# Patient Record
Sex: Female | Born: 1935 | Race: White | Hispanic: No | Smoking: Never smoker
Health system: Southern US, Community
[De-identification: ages and names within clinical notes are randomized; demographics above are authoritative.]

## PROBLEM LIST (undated history)

## (undated) DIAGNOSIS — I639 Cerebral infarction, unspecified: Secondary | ICD-10-CM

## (undated) DIAGNOSIS — E119 Type 2 diabetes mellitus without complications: Secondary | ICD-10-CM

## (undated) DIAGNOSIS — M1990 Unspecified osteoarthritis, unspecified site: Secondary | ICD-10-CM

## (undated) DIAGNOSIS — K5790 Diverticulosis of intestine, part unspecified, without perforation or abscess without bleeding: Secondary | ICD-10-CM

## (undated) DIAGNOSIS — I10 Essential (primary) hypertension: Secondary | ICD-10-CM

## (undated) HISTORY — PX: CHOLECYSTECTOMY: SHX55

## (undated) HISTORY — PX: OTHER SURGICAL HISTORY: SHX169

## (undated) HISTORY — PX: GASTROSTOMY TUBE PLACEMENT: SHX655

## (undated) HISTORY — PX: CARDIAC SURGERY: SHX584

---

## 2016-04-13 ENCOUNTER — Encounter (HOSPITAL_COMMUNITY): Payer: Self-pay

## 2016-04-13 ENCOUNTER — Observation Stay (HOSPITAL_COMMUNITY)
Admission: EM | Admit: 2016-04-13 | Discharge: 2016-04-18 | DRG: 394 | Disposition: A | Discharge status: Home / Self Care | Payer: BLUE CROSS/BLUE SHIELD | Attending: Internal Medicine | Admitting: Internal Medicine

## 2016-04-13 DIAGNOSIS — I10 Essential (primary) hypertension: Secondary | ICD-10-CM | POA: Insufficient documentation

## 2016-04-13 DIAGNOSIS — E119 Type 2 diabetes mellitus without complications: Secondary | ICD-10-CM | POA: Diagnosis not present

## 2016-04-13 DIAGNOSIS — L22 Diaper dermatitis: Secondary | ICD-10-CM | POA: Insufficient documentation

## 2016-04-13 DIAGNOSIS — K521 Toxic gastroenteritis and colitis: Secondary | ICD-10-CM | POA: Insufficient documentation

## 2016-04-13 DIAGNOSIS — T85598A Other mechanical complication of other gastrointestinal prosthetic devices, implants and grafts, initial encounter: Secondary | ICD-10-CM | POA: Diagnosis present

## 2016-04-13 DIAGNOSIS — Z7401 Bed confinement status: Secondary | ICD-10-CM | POA: Insufficient documentation

## 2016-04-13 DIAGNOSIS — Z8673 Personal history of transient ischemic attack (TIA), and cerebral infarction without residual deficits: Secondary | ICD-10-CM

## 2016-04-13 DIAGNOSIS — R5381 Other malaise: Secondary | ICD-10-CM | POA: Diagnosis not present

## 2016-04-13 DIAGNOSIS — F0150 Vascular dementia without behavioral disturbance: Secondary | ICD-10-CM | POA: Diagnosis not present

## 2016-04-13 DIAGNOSIS — K5900 Constipation, unspecified: Secondary | ICD-10-CM | POA: Diagnosis not present

## 2016-04-13 DIAGNOSIS — K219 Gastro-esophageal reflux disease without esophagitis: Secondary | ICD-10-CM | POA: Insufficient documentation

## 2016-04-13 DIAGNOSIS — Z794 Long term (current) use of insulin: Secondary | ICD-10-CM | POA: Diagnosis not present

## 2016-04-13 DIAGNOSIS — B964 Proteus (mirabilis) (morganii) as the cause of diseases classified elsewhere: Secondary | ICD-10-CM | POA: Diagnosis not present

## 2016-04-13 DIAGNOSIS — N3000 Acute cystitis without hematuria: Secondary | ICD-10-CM

## 2016-04-13 DIAGNOSIS — K9413 Enterostomy malfunction: Secondary | ICD-10-CM | POA: Insufficient documentation

## 2016-04-13 DIAGNOSIS — N390 Urinary tract infection, site not specified: Secondary | ICD-10-CM | POA: Diagnosis not present

## 2016-04-13 DIAGNOSIS — F0390 Unspecified dementia without behavioral disturbance: Secondary | ICD-10-CM | POA: Diagnosis not present

## 2016-04-13 DIAGNOSIS — M62461 Contracture of muscle, right lower leg: Secondary | ICD-10-CM | POA: Insufficient documentation

## 2016-04-13 DIAGNOSIS — Z79899 Other long term (current) drug therapy: Secondary | ICD-10-CM | POA: Diagnosis not present

## 2016-04-13 DIAGNOSIS — M62462 Contracture of muscle, left lower leg: Secondary | ICD-10-CM

## 2016-04-13 DIAGNOSIS — T3695XA Adverse effect of unspecified systemic antibiotic, initial encounter: Secondary | ICD-10-CM | POA: Diagnosis not present

## 2016-04-13 HISTORY — DX: Type 2 diabetes mellitus without complications: E11.9

## 2016-04-13 HISTORY — DX: Unspecified osteoarthritis, unspecified site: M19.90

## 2016-04-13 HISTORY — DX: Diverticulosis of intestine, part unspecified, without perforation or abscess without bleeding: K57.90

## 2016-04-13 HISTORY — DX: Cerebral infarction, unspecified: I63.9

## 2016-04-13 HISTORY — DX: Essential (primary) hypertension: I10

## 2016-04-13 LAB — CBC WITH DIFFERENTIAL/PLATELET
Basophils Absolute: 0 10*3/uL (ref 0.0–0.1)
Basophils Relative: 0 %
Eosinophils Absolute: 0.2 10*3/uL (ref 0.0–0.7)
Eosinophils Relative: 2 %
HCT: 31.7 % — ABNORMAL LOW (ref 36.0–46.0)
Hemoglobin: 10 g/dL — ABNORMAL LOW (ref 12.0–15.0)
Lymphocytes Relative: 22 %
Lymphs Abs: 2.4 10*3/uL (ref 0.7–4.0)
MCH: 25 pg — ABNORMAL LOW (ref 26.0–34.0)
MCHC: 31.5 g/dL (ref 30.0–36.0)
MCV: 79.3 fL (ref 78.0–100.0)
Monocytes Absolute: 0.8 10*3/uL (ref 0.1–1.0)
Monocytes Relative: 8 %
Neutro Abs: 7.2 10*3/uL (ref 1.7–7.7)
Neutrophils Relative %: 68 %
Platelets: 408 10*3/uL — ABNORMAL HIGH (ref 150–400)
RBC: 4 MIL/uL (ref 3.87–5.11)
RDW: 15.6 % — ABNORMAL HIGH (ref 11.5–15.5)
WBC: 10.6 10*3/uL — ABNORMAL HIGH (ref 4.0–10.5)

## 2016-04-13 LAB — CBG MONITORING, ED: Glucose-Capillary: 156 mg/dL — ABNORMAL HIGH (ref 65–99)

## 2016-04-13 LAB — COMPREHENSIVE METABOLIC PANEL WITH GFR
ALT: 12 U/L — ABNORMAL LOW (ref 14–54)
AST: 20 U/L (ref 15–41)
Albumin: 3.2 g/dL — ABNORMAL LOW (ref 3.5–5.0)
Alkaline Phosphatase: 49 U/L (ref 38–126)
Anion gap: 10 (ref 5–15)
BUN: 42 mg/dL — ABNORMAL HIGH (ref 6–20)
CO2: 22 mmol/L (ref 22–32)
Calcium: 9.4 mg/dL (ref 8.9–10.3)
Chloride: 106 mmol/L (ref 101–111)
Creatinine, Ser: 0.73 mg/dL (ref 0.44–1.00)
GFR calc Af Amer: >60 mL/min
GFR calc non Af Amer: >60 mL/min
Glucose, Bld: 140 mg/dL — ABNORMAL HIGH (ref 65–99)
Potassium: 4.9 mmol/L (ref 3.5–5.1)
Sodium: 138 mmol/L (ref 135–145)
Total Bilirubin: 0.4 mg/dL (ref 0.3–1.2)
Total Protein: 6.7 g/dL (ref 6.5–8.1)

## 2016-04-13 LAB — GLUCOSE, CAPILLARY: Glucose-Capillary: 173 mg/dL — ABNORMAL HIGH (ref 65–99)

## 2016-04-13 MED ORDER — ACETAMINOPHEN 650 MG RE SUPP
650.0000 mg | Freq: Four times a day (QID) | RECTAL | Status: DC | PRN
  Administered 2016-04-16: 650 mg via RECTAL
  Filled 2016-04-13 (×2): qty 1

## 2016-04-13 MED ORDER — ACETAMINOPHEN 325 MG PO TABS: 650.0000 mg | ORAL_TABLET | Freq: Four times a day (QID) | ORAL | Status: DC | PRN

## 2016-04-13 MED ORDER — LORAZEPAM 0.5 MG PO TABS
0.2500 mg | ORAL_TABLET | Freq: Four times a day (QID) | ORAL | Status: DC | PRN
  Administered 2016-04-14: 0.5 mg via ORAL
  Filled 2016-04-13: qty 1

## 2016-04-13 MED ORDER — VITAL 1.5 CAL PO LIQD
237.0000 mL | Freq: Four times a day (QID) | ORAL | Status: DC
  Filled 2016-04-13 (×2): qty 237

## 2016-04-13 MED ORDER — TRAZODONE HCL 50 MG PO TABS
50.0000 mg | ORAL_TABLET | Freq: Every day | ORAL | Status: DC
  Administered 2016-04-14 (×2): 50 mg
  Filled 2016-04-13: qty 1

## 2016-04-13 MED ORDER — ALOE VERA JUICE PO LIQD: 30.0000 mL | Freq: Every day | ORAL | Status: DC

## 2016-04-13 MED ORDER — ENOXAPARIN SODIUM 40 MG/0.4ML ~~LOC~~ SOLN
40.0000 mg | SUBCUTANEOUS | Status: DC
  Administered 2016-04-14 – 2016-04-18 (×4): 40 mg via SUBCUTANEOUS
  Filled 2016-04-13 (×4): qty 0.4

## 2016-04-13 MED ORDER — INSULIN GLARGINE 100 UNIT/ML ~~LOC~~ SOLN
8.0000 [IU] | Freq: Every day | SUBCUTANEOUS | Status: DC
  Administered 2016-04-14 – 2016-04-17 (×5): 8 [IU] via SUBCUTANEOUS
  Filled 2016-04-13 (×6): qty 0.08

## 2016-04-13 MED ORDER — DEXTROSE-NACL 5-0.45 % IV SOLN
INTRAVENOUS | Status: DC
  Administered 2016-04-13: 20:00:00 via INTRAVENOUS
  Administered 2016-04-14: 1000 mL via INTRAVENOUS
  Administered 2016-04-14 – 2016-04-15 (×2): via INTRAVENOUS

## 2016-04-13 MED ORDER — INSULIN ASPART 100 UNIT/ML ~~LOC~~ SOLN: 6.0000 [IU] | Freq: Three times a day (TID) | SUBCUTANEOUS | Status: DC

## 2016-04-13 MED ORDER — MAGNESIUM OXIDE 400 (241.3 MG) MG PO TABS
400.0000 mg | ORAL_TABLET | Freq: Every day | ORAL | Status: DC
  Administered 2016-04-15: 400 mg
  Filled 2016-04-13 (×4): qty 1

## 2016-04-13 MED ORDER — LOSARTAN POTASSIUM 50 MG PO TABS
50.0000 mg | ORAL_TABLET | Freq: Every day | ORAL | Status: DC
  Administered 2016-04-14 – 2016-04-17 (×5): 50 mg
  Filled 2016-04-13 (×4): qty 1

## 2016-04-13 NOTE — ED Provider Notes (Signed)
[facility]-EMERGENCY DEPT Provider Note   CSN: [id number] Arrival date & time: 04/13/16  1641     History   Chief Complaint No chief complaint on file.   HPI Susan Mcguire is a 82 y.o. female.  Patient is [place] has a history of strokes and J-tube placement here with leaking of her J-tube. Patient's caregiver state that the tube is at least 80 years old and she gets her medicines and fluids to there however most recently patient has not been able to get all her medications or fluids secondary to leaking. they called around to try to get some help and no one will see them because they did not put in the tube. Have been taping and that it wouldn't leak however this morning with significantly worsening of her here for evaluation.   The history is provided by a relative.    Past Medical History:  Diagnosis Date  . Arthritis   . Diabetes mellitus without complication (HCC)   . Diverticulosis   . Hypertension   . Stroke [facility](HCC)    2    Patient Active Problem List   Diagnosis Date Noted  . Jejunostomy tube leak (HCC) 04/13/2016    Past Surgical History:  Procedure Laterality Date  . CARDIAC SURGERY     stent placement   . CHOLECYSTECTOMY    . GASTROSTOMY TUBE PLACEMENT     was removed and replaced   . Jtube      OB History    Gravida Para Term Preterm AB Living   11 9 9     9    SAB TAB Ectopic Multiple Live Births           9       Home Medications    Prior to Admission medications   Not on File    Family History No family history on file.  Social History Social History  Substance Use Topics  . Smoking status: Never Smoker  . Smokeless tobacco: Never Used  . Alcohol use No     Allergies   Patient has no known allergies.   Review of Systems Review of Systems  Unable to perform ROS: Patient nonverbal     Physical Exam Updated Vital Signs BP (!) 140/107   Pulse 75   Temp 98.7 F (37.1 C) (Rectal)   Resp 21   Ht 5\' 5"  (1.651 m)   Wt 170  lb (77.1 kg)   SpO2 100%   BMI 28.29 kg/m   Physical Exam  Constitutional: She appears well-developed and well-nourished.  HENT:  Head: Normocephalic and atraumatic.  Eyes: Conjunctivae and EOM are normal.  Neck: Normal range of motion.  Cardiovascular: Normal rate and regular rhythm.   Pulmonary/Chest: Effort normal. No stridor. No respiratory distress.  Abdominal: Soft. She exhibits no distension. There is no tenderness.  j tube in place, taped, no e/o infection or bleeding around the site  Musculoskeletal: She exhibits deformity (contractures of upper and lower extremities). She exhibits no edema.  Neurological: She is alert.  Skin: No erythema. No pallor.  Dry, tenting skin  Nursing note and vitals reviewed.    ED Treatments / Results  Labs (all labs ordered are listed, but only abnormal results are displayed) Labs Reviewed  CBC WITH DIFFERENTIAL/PLATELET - Abnormal; Notable for the following:       Result Value   WBC 10.6 (*)    Hemoglobin 10.0 (*)    HCT 31.7 (*)    MCH 25.0 (*)  RDW 15.6 (*)    Platelets 408 (*)    All other components within normal limits  COMPREHENSIVE METABOLIC PANEL - Abnormal; Notable for the following:    Glucose, Bld 140 (*)    BUN 42 (*)    Albumin 3.2 (*)    ALT 12 (*)    All other components within normal limits  CBG MONITORING, ED - Abnormal; Notable for the following:    Glucose-Capillary 156 (*)    All other components within normal limits    EKG  EKG Interpretation None       Radiology No results found.  Procedures Procedures (including critical care time)  Medications Ordered in ED Medications  dextrose 5 %-0.45 % sodium chloride infusion ( Intravenous New Bag/Given 04/13/16 2002)     Initial Impression / Assessment and Plan / ED Course  I have reviewed the triage vital signs and the nursing notes.  Pertinent labs & imaging results that were available during my care of the patient were reviewed by me and  considered in my medical decision making (see chart for details).  Clinical Course     Likely slightly dehydrated on exam. j tube cracked/taped/leaking, will consult medicine for admission for IR replacement, IVF, medication administration.   Final Clinical Impressions(s) / ED Diagnoses   Final diagnoses:  Jejunostomy tube leak [facility](HCC)    New Prescriptions New Prescriptions   No medications on file     Marily MemosJason Adda Stokes, MD 04/13/16 2032

## 2016-04-13 NOTE — ED Triage Notes (Signed)
Pt. Has a J tube because of dysphasia from her 2nd CVA. Family member sts that the tube is deteriorated and she is unable to feed her through it anymore.

## 2016-04-13 NOTE — ED Notes (Signed)
Assessment: G-tube is leaking per family. Family member wrapped it in tape to stop leaking Dressing clean, dry and recently changed

## 2016-04-13 NOTE — H&P (Addendum)
[facility] History and Physical  Susan Mcguire BJY:[id number] DOB: 07/27/1928 DOA: 04/13/2016  Referring physician: Dr. Clayborne Mcguire PCP: No PCP Per Patient   Chief Complaint: Leaking J-tube  HPI: Susan Mcguire is a 82 y.o. female with history of IDDM, HTN and had two major CVA's in 2015, subsequent smaller CVA's and some seizures.  Is debilitated, bedbound with contractures and J-tube and is cared for by her daughter, [name].  [place] live in [place] about two mos [place].  The J-tube has been leaking and dtr has been using tape to try and decrease the leaking but she was concerned so brought the patient to ED to see about getting the J tube replaced.  It has been in two years and "is old and cracking".    She takes Lantus 8units qhs and meal coverage Humalog 4-7 units with each "meal".  A meal is a supplement (Peptamen) usually infused over 2-2.5 hours 4 times per day.  Other meds include ativan, losartan, Mg, Desyrel.    ROS  dtr says no fevers, or other new issues recently     Past Medical History  Past Medical History:  Diagnosis Date  . Arthritis   . Diabetes mellitus without complication (HCC)   . Diverticulosis   . Hypertension   . Stroke [facility](HCC)    2   Past Surgical History  Past Surgical History:  Procedure Laterality Date  . CARDIAC SURGERY     stent placement   . CHOLECYSTECTOMY    . GASTROSTOMY TUBE PLACEMENT     was removed and replaced   . Jtube     Family History No family history on file. Social History  reports that she has never smoked. She has never used smokeless tobacco. She reports that she does not drink alcohol or use drugs. Allergies No Known Allergies Home medications Prior to Admission medications   Medication Sig Start Date End Date Taking? Authorizing Provider  ALOE VERA JUICE LIQD Place 30 mLs into feeding tube daily.   Yes Historical Provider, MD  ARNICA EX Apply 1 application topically  daily as needed (wound care).   Yes Historical Provider, MD  Carboxymethylcellul-Glycerin (REFRESH OPTIVE OP) Place 1 drop into both eyes 2 (two) times daily.   Yes Historical Provider, MD  insulin glargine (LANTUS) 100 unit/mL SOPN Inject 8 Units into the skin at bedtime.   Yes Historical Provider, MD  insulin lispro (HUMALOG KWIKPEN) 100 UNIT/ML KiwkPen Inject 4-7 Units into the skin 4 (four) times daily.   Yes Historical Provider, MD  LORazepam (ATIVAN) 0.5 MG tablet Take 0.125-0.5 mg by mouth See admin instructions. Take 1 tablet (0.5 mg) per tube daily at bedtime and may also take 1/4-1 tablet during the day as needed for anxiety   Yes Historical Provider, MD  losartan (COZAAR) 50 MG tablet Place 50 mg into feeding tube daily at 6 PM.   Yes Historical Provider, MD  Magnesium 400 MG TABS Place 400 mg into feeding tube daily.   Yes Historical Provider, MD  OVER THE COUNTER MEDICATION Place into feeding tube See admin instructions. Immune shots - put 1 spray in water daily   Yes Historical Provider, MD  OVER THE COUNTER MEDICATION Apply topically See admin instructions. Apply oregano spray to groin daily   Yes Historical Provider, MD  OVER THE COUNTER MEDICATION Apply topically See admin instructions. Mix together Desitin, 2% miconazole, hydrocortisone cream (otc) and silvadene cream - apply to  buttocks for diaper rash   Yes Historical Provider, MD  OVER THE COUNTER MEDICATION Apply topically 2 (two) times daily. Onidol Cream from [employer] - apply to chest twice daily   Yes Historical Provider, MD  OVER THE COUNTER MEDICATION Apply 1 application topically See admin instructions. Add Colloidal silver to bathing water   Yes Historical Provider, MD  PEPTAMEN 1.5 (PEPTAMEN 1.5) LIQD Place 1,000 mLs into feeding tube See admin instructions. 125 ml/hour (4 times daily)   Yes Historical Provider, MD  traZODone (DESYREL) 50 MG tablet Place 50 mg into feeding tube at bedtime.   Yes Historical Provider,  MD   Liver Function Tests  Recent Labs Lab 04/13/16 1825  AST 20  ALT 12*  ALKPHOS 49  BILITOT 0.4  PROT 6.7  ALBUMIN 3.2*   No results for input(s): LIPASE, AMYLASE in the last 168 hours. CBC  Recent Labs Lab 04/13/16 1825  WBC 10.6*  NEUTROABS 7.2  HGB 10.0*  HCT 31.7*  MCV 79.3  PLT 408*   Basic Metabolic Panel  Recent Labs Lab 04/13/16 1825  NA 138  K 4.9  CL 106  CO2 22  GLUCOSE 140*  BUN 42*  CREATININE 0.73  CALCIUM 9.4     Vitals:   04/13/16 1705 04/13/16 2003 04/13/16 2154 04/13/16 2203  BP: 140/90 (!) 140/107 (!) 222/209 (!) 187/75  Pulse: 67 75 71 72  Resp: 18 21 20    Temp: 98.7 F (37.1 C)  97.6 F (36.4 C)   TempSrc: Rectal  Axillary   SpO2: 100% 100% 100%   Weight:      Height:       Exam: Gen elderly lady, fidgety, resists exam, does not follow commands No rash, cyanosis or gangrene Sclera anicteric, throat Mcguire, not able to examine  No jvd or bruits Chest clear bilat RRR no MRG Abd soft ntnd no mass or ascites +bs, large J-tube mid left abdomen, exit site uninflamed, J-tube material has cuts/ cracks in it GU defer MS bilat UE and LE contractures, no wounds or ulcers, skin intact Ext no LE edema Neuro does not track, some nystagmus   Na 138  K 4.9, BUN 42 Cr 0.73  Glu 140  Alb 3.2  LFT's ok WBC 10k  Hb 10   plt 408    Assessment: 1. Cracked / leaking J-tube - 82 yrs old. Will need to consult GI in am. In meantime try bolus rather than drip feeds.  2. SP CVA's 3. Dementia 4. Debility - bedbound w contractures 5. HTN cont losartan 6. IDDM - cover w lantus 8 qpm and 6 u Humalog each bolus meal qid 7. Vol depletion- IVF's   Plan -  As above     Susan Mcguire [facility] Pager [phone]   If 7PM-7AM, please contact night-coverage [email or web address] Password [email or web address] 04/13/2016, 10:23 PM

## 2016-04-13 NOTE — Progress Notes (Signed)
NURSING PROGRESS NOTE  Clotilde DieterRosa ReyesMRN: [id number] Admission Data: 04/13/2016 at 10PM Attending Provider: Delano Metzobert Schertz, MD PCP: No PCP Per Patient Code status: Full  Allergies: No Known Allergies  Past Medical History:  Past Medical History:  Diagnosis Date  . Arthritis   . Diabetes mellitus without complication (HCC)   . Diverticulosis   . Hypertension   . Stroke [facility](HCC)    2   Past Surgical History:  Past Surgical History:  Procedure Laterality Date  . CARDIAC SURGERY     stent placement   . CHOLECYSTECTOMY    . GASTROSTOMY TUBE PLACEMENT     was removed and replaced   . Jtube     Gae DryRosa Bobb is a 82 y.o. female patient, arrived to floor in room [id number] via stretcher, transferred from ED. Patient is only responsive to voice and is nonverbal. No acute distress noted.  Vital signs: Oral temperature 97.6 F (36.4 C), Blood pressure 187/75, Pulse 72, RR 20, SpO2 100 % on room air. Height 5'5", weight 170 lbs (77.1 kg).   IV access: Left hand; condition patent and no redness. 5% dextrose-0.45% normal saline infusing at 18600mL/hr  Skin: intact, no pressure ulcer noted in sacral area. Second verified by Sarita BottomKelli J,. RN. Patient's family prefers to turn and change patient as needed. Patient's family refuses for staff to turn patient throughout the night because the daughters likes to keep the patient lying on her right side  Patient's ID armband verified with patient/ family, and in place. Information packet given to patient/ family. Fall risk assessed, SR up X2, patient/ family able to verbalize understanding of risks associated with falls and to call nurse or staff to assist before getting out of bed. Patient/ family oriented to room and equipment. Call bell within reach.

## 2016-04-14 ENCOUNTER — Encounter (HOSPITAL_COMMUNITY): Payer: Self-pay | Admitting: Physician Assistant

## 2016-04-14 DIAGNOSIS — N3000 Acute cystitis without hematuria: Secondary | ICD-10-CM

## 2016-04-14 LAB — URINE MICROSCOPIC-ADD ON

## 2016-04-14 LAB — GLUCOSE, CAPILLARY
GLUCOSE-CAPILLARY: 159 mg/dL — AB (ref 65–99)
GLUCOSE-CAPILLARY: 133 mg/dL — AB (ref 65–99)
Glucose-Capillary: 120 mg/dL — ABNORMAL HIGH (ref 65–99)
Glucose-Capillary: 126 mg/dL — ABNORMAL HIGH (ref 65–99)

## 2016-04-14 LAB — URINALYSIS, ROUTINE W REFLEX MICROSCOPIC
BILIRUBIN URINE: NEGATIVE
Glucose, UA: NEGATIVE mg/dL
KETONES UR: NEGATIVE mg/dL
NITRITE: NEGATIVE
PH: 6.5 (ref 5.0–8.0)
Protein, ur: NEGATIVE mg/dL
SPECIFIC GRAVITY, URINE: 1.016 (ref 1.005–1.030)

## 2016-04-14 MED ORDER — PEPTAMEN 1.5 CAL PO LIQD
1000.0000 mL | ORAL | Status: DC
  Administered 2016-04-14 – 2016-04-17 (×4): 1000 mL

## 2016-04-14 MED ORDER — LOSARTAN POTASSIUM 50 MG PO TABS
ORAL_TABLET | ORAL | Status: AC
  Administered 2016-04-14
  Filled 2016-04-14: qty 1

## 2016-04-14 MED ORDER — FAMOTIDINE IN NACL 20-0.9 MG/50ML-% IV SOLN
20.0000 mg | INTRAVENOUS | Status: DC
  Administered 2016-04-14 – 2016-04-18 (×3): 20 mg via INTRAVENOUS
  Filled 2016-04-14 (×5): qty 50

## 2016-04-14 MED ORDER — DEXTROSE 5 % IV SOLN
1.0000 g | INTRAVENOUS | Status: DC
  Administered 2016-04-14 – 2016-04-15 (×2): 1 g via INTRAVENOUS
  Filled 2016-04-14 (×3): qty 10

## 2016-04-14 MED ORDER — TRAZODONE HCL 50 MG PO TABS
ORAL_TABLET | ORAL | Status: AC
  Administered 2016-04-14: 50 mg
  Filled 2016-04-14: qty 1

## 2016-04-14 MED ORDER — INSULIN ASPART 100 UNIT/ML ~~LOC~~ SOLN
0.0000 [IU] | Freq: Three times a day (TID) | SUBCUTANEOUS | Status: DC
  Administered 2016-04-14: 2 [IU] via SUBCUTANEOUS
  Administered 2016-04-14: 1 [IU] via SUBCUTANEOUS
  Administered 2016-04-15: 2 [IU] via SUBCUTANEOUS
  Administered 2016-04-15: 3 [IU] via SUBCUTANEOUS
  Administered 2016-04-15: 1 [IU] via SUBCUTANEOUS
  Administered 2016-04-16 – 2016-04-17 (×3): 2 [IU] via SUBCUTANEOUS
  Administered 2016-04-18: 3 [IU] via SUBCUTANEOUS
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
## 2016-04-14 NOTE — Progress Notes (Signed)
Patient's daughter is refusing anything through the tube due to leaking; she said that Vital feeding supplement is making her having gas.. Dietician consult requested. Will continue to monitor.

## 2016-04-14 NOTE — Progress Notes (Addendum)
Initial Nutrition Assessment  DOCUMENTATION CODES:   Not applicable  INTERVENTION:    As medically appropriate, initiate Pepatmen 1.5 formula via J-tube at 20 ml/hr and increase by 10 ml every 4 hours to goal rate of 55 ml/hr  Total TF regimen to provide 1980 kcals, 90 gm protein, 1019 ml of free water  NUTRITION DIAGNOSIS:   Inadequate oral intake related to inability to eat as evidenced by NPO status  GOAL:   Patient will meet greater than or equal to 90% of their needs  MONITOR:   TF tolerance, Labs, Weight trends, Skin, I & O's  REASON FOR ASSESSMENT:   Consult Assessment of nutrition requirement/status  ASSESSMENT:   82 y.o. Female with history of IDDM, HTN and had two major CVA's in 2015, subsequent smaller CVA's and some seizures.  Is debilitated, bedbound with contractures and J-tube and is cared for by her daughter, [name].  [place].  The J-tube has been leaking and dtr has been using tape to try and decrease the leaking but she was concerned so brought the patient to ED to see about getting the J tube replaced.  It has been in two years and "is old and cracking".    Pt's daughter reports pt takes nothing by mouth. At home daughter infuses 1 carton (250 ml) of Peptamen 1.5 formula over 2-2.5 hours (via feeding pump) 4 times daily. Provides total of 1500 kcals, 68 gm protein, 772 ml of free water daily. Daughter also endorses pt has lost about 10 lbs in the last month. CBG's [id number].  Unable to complete Nutrition Focused Physical Exam at this time. Daughter prefers to use her formula from home.   States her Mother does not tolerate any other formula.  Diet Order:  Diet NPO time specified  Skin:  Reviewed, no issues  Last BM:  12/1  Height:   Ht Readings from Last 1 Encounters:  04/14/16 5\' 5"  (1.651 m)    Weight:   Wt Readings from Last 1  Encounters:  04/14/16 168 lb 14 oz (76.6 kg)    Ideal Body Weight:  56.8 kg  BMI:  Body mass index is 28.1 kg/m.  Estimated Nutritional Needs:   Kcal:  1800-2000  Protein:  90-100 gm  Fluid:  1.8-2.0 L  EDUCATION NEEDS:   No education needs identified at this time  Maureen ChattersKatie Ezechiel [place], RD, LDN Pager #: [phone] After-Hours Pager #: [phone]

## 2016-04-15 LAB — GLUCOSE, CAPILLARY
GLUCOSE-CAPILLARY: 159 mg/dL — AB (ref 65–99)
GLUCOSE-CAPILLARY: 149 mg/dL — AB (ref 65–99)
Glucose-Capillary: 224 mg/dL — ABNORMAL HIGH (ref 65–99)
Glucose-Capillary: 122 mg/dL — ABNORMAL HIGH (ref 65–99)

## 2016-04-15 MED ORDER — DOCUSATE SODIUM 50 MG/5ML PO LIQD: 100.0000 mg | Freq: Two times a day (BID) | ORAL | Status: DC | PRN

## 2016-04-15 MED ORDER — NYSTATIN 100000 UNIT/GM EX CREA
TOPICAL_CREAM | Freq: Two times a day (BID) | CUTANEOUS | Status: DC
  Administered 2016-04-15 – 2016-04-18 (×5): via TOPICAL
  Filled 2016-04-15: qty 15

## 2016-04-15 MED ORDER — SODIUM CHLORIDE 0.9 % IV SOLN
INTRAVENOUS | Status: DC
  Administered 2016-04-15: 1000 mL via INTRAVENOUS

## 2016-04-15 MED ORDER — LORAZEPAM 0.5 MG PO TABS
0.5000 mg | ORAL_TABLET | Freq: Every evening | ORAL | Status: DC | PRN
  Administered 2016-04-15 – 2016-04-17 (×3): 0.5 mg
  Filled 2016-04-15 (×3): qty 1

## 2016-04-15 MED ORDER — POLYETHYLENE GLYCOL 3350 17 G PO PACK
17.0000 g | PACK | Freq: Every day | ORAL | Status: DC
  Administered 2016-04-15: 17 g via ORAL
  Filled 2016-04-15: qty 1

## 2016-04-15 MED ORDER — TRAZODONE HCL 50 MG PO TABS
50.0000 mg | ORAL_TABLET | Freq: Every day | ORAL | Status: DC
  Administered 2016-04-15 – 2016-04-17 (×3): 50 mg
  Filled 2016-04-15 (×3): qty 1

## 2016-04-15 MED ORDER — LORAZEPAM 0.5 MG PO TABS: 0.2500 mg | ORAL_TABLET | Freq: Four times a day (QID) | ORAL | Status: DC | PRN

## 2016-04-15 NOTE — Progress Notes (Signed)
Tube feeding to be stopped after midnight, per MD verbal order - plan for J-tube exchange tomorrow.

## 2016-04-15 NOTE — Progress Notes (Addendum)
Patient had this AM a medium BM and her daughter wanted to hold for now the tap enema. Patient received only Miralax. Education provided to family regarding Foley. Will continue to monitor.
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
## 2016-04-15 NOTE — Progress Notes (Deleted)
Unable to document increase of home formula tube feeds via Epic, increased rate to 30cc/hr from 20cc/hr at 2000, 30cc/hr tp 40 cc/hr at 2100, 40cc/hr at 2200, and increased to goal of 55cc/hr at 2300. Will continue to monitor intake and tolerance. 

## 2016-04-15 NOTE — Progress Notes (Signed)
Unable to document increase of home formula tube feeds via Epic, increased rate to 30cc/hr from 20cc/hr at 2000, 30cc/hr tp 40 cc/hr at 2100, 40cc/hr at 2200, and increased to goal of 55cc/hr at 2300. Will continue to monitor intake and tolerance.

## 2016-04-16 ENCOUNTER — Encounter (HOSPITAL_COMMUNITY): Payer: Self-pay | Admitting: Interventional Radiology

## 2016-04-16 ENCOUNTER — Inpatient Hospital Stay (HOSPITAL_COMMUNITY): Payer: BLUE CROSS/BLUE SHIELD

## 2016-04-16 HISTORY — PX: IR GENERIC HISTORICAL: IMG1180011

## 2016-04-16 LAB — CLOSTRIDIUM DIFFICILE BY PCR: CDIFFPCR: NEGATIVE

## 2016-04-16 LAB — URINE CULTURE

## 2016-04-16 LAB — C DIFFICILE QUICK SCREEN W PCR REFLEX
C DIFFICILE (CDIFF) TOXIN: NEGATIVE
C Diff antigen: POSITIVE — AB

## 2016-04-16 LAB — GLUCOSE, CAPILLARY
GLUCOSE-CAPILLARY: 120 mg/dL — AB (ref 65–99)
GLUCOSE-CAPILLARY: 131 mg/dL — AB (ref 65–99)
Glucose-Capillary: 106 mg/dL — ABNORMAL HIGH (ref 65–99)
Glucose-Capillary: 158 mg/dL — ABNORMAL HIGH (ref 65–99)
Glucose-Capillary: 159 mg/dL — ABNORMAL HIGH (ref 65–99)

## 2016-04-16 MED ORDER — LIDOCAINE VISCOUS 2 % MT SOLN
OROMUCOSAL | Status: AC
  Administered 2016-04-16: 2 mL
  Filled 2016-04-16: qty 15

## 2016-04-16 MED ORDER — PANTOPRAZOLE SODIUM 40 MG PO PACK
40.0000 mg | PACK | Freq: Every day | ORAL | Status: DC
  Administered 2016-04-16 – 2016-04-18 (×3): 40 mg
  Filled 2016-04-16 (×3): qty 20

## 2016-04-16 MED ORDER — IOPAMIDOL (ISOVUE-300) INJECTION 61%
INTRAVENOUS | Status: AC
  Administered 2016-04-16: 30 mL
  Filled 2016-04-16: qty 50

## 2016-04-16 MED ORDER — CEFDINIR 125 MG/5ML PO SUSR
250.0000 mg | Freq: Two times a day (BID) | ORAL | Status: DC
  Administered 2016-04-16 – 2016-04-17 (×4): 250 mg via ORAL
  Filled 2016-04-16 (×5): qty 10

## 2016-04-16 NOTE — Progress Notes (Addendum)
Pt has returned from IR with new J-tube. Nurse spoke with IR, who said its okay to start tube feedings back. Dr Susie CassetteAbrol also said it's okay to start previous feeding back up. Family has started her previous feedings from home. Family requesting to speak with the Dr regarding updates. Dr Susie CassetteAbrol made aware. Tylenol suppository administered d/t discomfort.

## 2016-04-16 NOTE — Progress Notes (Signed)
Daughter (POA) in room, changing tube feeding rate to 75 ml/hr even though ordered rate is 1755ml/hr. Throughout day daughter has been operating feeding pump without talking with nurse first. Nurse has been re-educating daughter throughout day. Night nurse aware of situation.
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
## 2016-04-16 NOTE — Progress Notes (Signed)
Pt has no IV access. Day shift RN Cyndi stated MD aware. Will continue to monitor pt. Nelda MarseilleJenny Thacker, RN

## 2016-04-16 NOTE — Progress Notes (Signed)
Pt tube feedings have been stopped for procedure in the morning.

## 2016-04-16 NOTE — Progress Notes (Signed)
Family has many concerns this am regarding J-tube exchange that's planned for today. Called Radiology, who recommended that the family come down with pt to talk with dr. IV leaking this morning and was removed. Pt very hard stick. IV team paged. Foley Catheter leaking. Paged Dr Susie Cassetteabrol, who ordered to remove foley catheter and keep it out until she evaluates pt. Will continue to monitor pt. Family at bedside and is very attentive.

## 2016-04-16 NOTE — Progress Notes (Signed)
Pt being transported to IR for J-tube exchange. No IV access currently. Spoke with IR regarding that, who said to go ahead and transport pt. Family going with pt to speak with dr about current concerns.

## 2016-04-16 NOTE — Progress Notes (Signed)
Pt's tube feeding currently running at 6275ml/hr instead of ordered rate of 5255ml/hr. Talked with daughter and daughter agreed to turn tube feeds down to 2555ml/hr. Daughter is operating feeding pump and putting in new carton of tube feed in bag that she brought from home when needed. Daughter states that she never runs tube feedings at night and will only run tube feeding to midnight or 1 am tonight. Order is written for continuous tube feeding at 55 ml/hr. Told daughter to let nurse know when tube feeding is turned off tonight, daughter stated that she would let RN know. Will continue to monitor pt. Nelda MarseilleJenny Thacker, RN

## 2016-04-16 NOTE — Progress Notes (Addendum)
Notified Schorr, NP via text page that pt's cdiff results came back: cdiff toxin negative and cdiff antigen is positive. Pt's daughter requesting cdiff results told her that NP had been notified of results and MD would discuss results with her in the morning. No new orders entered by NP. Will continue to monitor pt. Nelda MarseilleJenny Thacker, RN
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
## 2016-04-17 ENCOUNTER — Encounter (HOSPITAL_COMMUNITY): Payer: Self-pay

## 2016-04-17 LAB — COMPREHENSIVE METABOLIC PANEL
ALK PHOS: 49 U/L (ref 38–126)
ALT: 26 U/L (ref 14–54)
ANION GAP: 6 (ref 5–15)
AST: 34 U/L (ref 15–41)
Albumin: 3.2 g/dL — ABNORMAL LOW (ref 3.5–5.0)
BILIRUBIN TOTAL: 1.3 mg/dL — AB (ref 0.3–1.2)
BUN: 19 mg/dL (ref 6–20)
CALCIUM: 9.2 mg/dL (ref 8.9–10.3)
CO2: 25 mmol/L (ref 22–32)
Chloride: 107 mmol/L (ref 101–111)
Creatinine, Ser: 0.68 mg/dL (ref 0.44–1.00)
GFR calc non Af Amer: >60 mL/min (ref >60)
Glucose, Bld: 101 mg/dL — ABNORMAL HIGH (ref 65–99)
POTASSIUM: 4.9 mmol/L (ref 3.5–5.1)
SODIUM: 138 mmol/L (ref 135–145)
TOTAL PROTEIN: 6.4 g/dL — AB (ref 6.5–8.1)

## 2016-04-17 LAB — MAGNESIUM: MAGNESIUM: 1.9 mg/dL (ref 1.7–2.4)

## 2016-04-17 LAB — CBC
HCT: 31.3 % — ABNORMAL LOW (ref 36.0–46.0)
Hemoglobin: 9.9 g/dL — ABNORMAL LOW (ref 12.0–15.0)
MCH: 25 pg — AB (ref 26.0–34.0)
MCHC: 31.6 g/dL (ref 30.0–36.0)
MCV: 79 fL (ref 78.0–100.0)
PLATELETS: 249 10*3/uL (ref 150–400)
RBC: 3.96 MIL/uL (ref 3.87–5.11)
RDW: 15.9 % — ABNORMAL HIGH (ref 11.5–15.5)
WBC: 8.5 10*3/uL (ref 4.0–10.5)

## 2016-04-17 LAB — GLUCOSE, CAPILLARY
GLUCOSE-CAPILLARY: 161 mg/dL — AB (ref 65–99)
GLUCOSE-CAPILLARY: 165 mg/dL — AB (ref 65–99)
Glucose-Capillary: 109 mg/dL — ABNORMAL HIGH (ref 65–99)
Glucose-Capillary: 134 mg/dL — ABNORMAL HIGH (ref 65–99)

## 2016-04-17 MED ORDER — JEVITY 1.2 CAL PO LIQD
1000.0000 mL | ORAL | Status: DC
  Filled 2016-04-17 (×2): qty 1000

## 2016-04-17 MED ORDER — PEPTAMEN 1.5 CAL PO LIQD
1000.0000 mL | ORAL | Status: DC
  Administered 2016-04-18: 1000 mL
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
## 2016-04-17 NOTE — Progress Notes (Signed)
Stopped tube feedings and clamped tube per daughter request. Daughter states that she has got all she needs and will start back later today with tube feedings again. Will continue to monitor pt. Nelda MarseilleJenny Thacker, RN

## 2016-04-17 NOTE — Progress Notes (Signed)
RN informed patient's daughter that the doctor had placed an order today for q4 hour CBG monitoring and that the RN or Nurse Tech would be checking the patient's CBG every four hours throughout the night. Patient's daughter refused for staff to check her mother's CBG every 4 hours throughout the night. Patient's daughter only wanted RN to take patient's CBG at bedtime since the patient is getting Lantus. Will continue to monitor and treat per MD orders.
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
## 2016-04-18 LAB — GLUCOSE, CAPILLARY: Glucose-Capillary: 171 mg/dL — ABNORMAL HIGH (ref 65–99)

## 2016-04-18 MED ORDER — PANTOPRAZOLE SODIUM 40 MG PO PACK: 40.0000 mg | PACK | Freq: Every day | ORAL | 12 refills | Status: AC

## 2016-04-18 MED ORDER — SACCHAROMYCES BOULARDII 250 MG PO CAPS
250.0000 mg | ORAL_CAPSULE | Freq: Two times a day (BID) | ORAL | Status: DC
  Administered 2016-04-18: 250 mg via ORAL
  Filled 2016-04-18: qty 1

## 2016-04-18 MED ORDER — NYSTATIN 100000 UNIT/GM EX CREA: TOPICAL_CREAM | Freq: Two times a day (BID) | CUTANEOUS | 2 refills | Status: AC

## 2016-04-18 MED ORDER — SACCHAROMYCES BOULARDII 250 MG PO CAPS: 250.0000 mg | ORAL_CAPSULE | Freq: Two times a day (BID) | ORAL | 0 refills | Status: AC

## 2016-04-18 MED ORDER — CEFDINIR 125 MG/5ML PO SUSR
250.0000 mg | Freq: Two times a day (BID) | ORAL | Status: DC
  Filled 2016-04-18: qty 10

## 2016-04-18 NOTE — Progress Notes (Addendum)
Nsg Discharge Note  Admit Date:  04/13/2016 Discharge date: 04/18/2016   Gae Dryosa Ogata to be D/C'd Home per MD order.  AVS completed.  Copy for chart, and copy for patient signed, and dated. Patient/caregiver able to verbalize understanding.  Discharge Medication:   Medication List    TAKE these medications   ALOE VERA JUICE Liqd Place 30 mLs into feeding tube daily.   ARNICA EX Apply 1 application topically daily as needed (wound care).   HUMALOG KWIKPEN 100 UNIT/ML KiwkPen Generic drug:  insulin lispro Inject 4-7 Units into the skin 4 (four) times daily.   insulin glargine 100 unit/mL Sopn Commonly known as:  LANTUS Inject 8 Units into the skin at bedtime.   LORazepam 0.5 MG tablet Commonly known as:  ATIVAN Take 0.125-0.5 mg by mouth See admin instructions. Take 1 tablet (0.5 mg) per tube daily at bedtime and may also take 1/4-1 tablet during the day as needed for anxiety   losartan 50 MG tablet Commonly known as:  COZAAR Place 50 mg into feeding tube daily at 6 PM.   Magnesium 400 MG Tabs Place 400 mg into feeding tube daily.   nystatin cream Commonly known as:  MYCOSTATIN Apply topically 2 (two) times daily.   OVER THE COUNTER MEDICATION Place into feeding tube See admin instructions. Immune shots - put 1 spray in water daily   OVER THE COUNTER MEDICATION Apply topically See admin instructions. Apply oregano spray to groin daily   OVER THE COUNTER MEDICATION Apply topically See admin instructions. Mix together Desitin, 2% miconazole, hydrocortisone cream (otc) and silvadene cream - apply to buttocks for diaper rash   OVER THE COUNTER MEDICATION Apply topically 2 (two) times daily. Onidol Cream from [employer] - apply to chest twice daily   OVER THE COUNTER MEDICATION Apply 1 application topically See admin instructions. Add Colloidal silver to bathing water   pantoprazole sodium 40 mg/20 mL Pack Commonly known as:  PROTONIX Place 20 mLs (40 mg total)  into feeding tube daily.   PEPTAMEN 1.5 Liqd Place 1,000 mLs into feeding tube See admin instructions. 125 ml/hour (4 times daily)   REFRESH OPTIVE OP Place 1 drop into both eyes 2 (two) times daily.   saccharomyces boulardii 250 MG capsule Commonly known as:  FLORASTOR Take 1 capsule (250 mg total) by mouth 2 (two) times daily.   traZODone 50 MG tablet Commonly known as:  DESYREL Place 50 mg into feeding tube at bedtime.       Discharge Assessment: Vitals:   04/17/16 2127 04/18/16 0634  BP: (!) 142/92 131/61  Pulse: 62 64  Resp: 18 16  Temp: 98.7 F (37.1 C) 98.7 F (37.1 C)   Skin clean, dry and intact without evidence of skin break down, no evidence of skin tears noted. IV catheter discontinued intact. Site without signs and symptoms of complications - no redness or edema noted at insertion site, patient denies c/o pain - only slight tenderness at site.  Dressing with slight pressure applied.  D/c Instructions-Education: Discharge instructions given to patient/family with verbalized understanding. D/c education completed with patient/family including follow up instructions, medication list, d/c activities limitations if indicated, with other d/c instructions as indicated by MD - patient able to verbalize understanding, all questions fully answered. Patient instructed to return to ED, call 911, or call MD for any changes in condition.  Patient dc'd and will transported via [facility] to home address.  Transportation arrived 15:17 Camillo FlamingVicki L Lupe Handley, RN 04/18/2016 1:34  PM
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
## 2016-04-18 NOTE — Progress Notes (Signed)
Daily weight is ordered for this patient. Patient's daughter has placed a mattress overlay/topper under her mother that she brought from home. RN and nurse tech stated that the mattress topper has to be taken off of the bed in order to obtain an accurate weight of the patient. Patient's daughter refused for her mother to be weighed this morning. Will continue to monitor and treat per MD orders.

## 2016-04-26 ENCOUNTER — Telehealth: Payer: Self-pay | Admitting: General Surgery

## 2016-04-26 ENCOUNTER — Other Ambulatory Visit (HOSPITAL_COMMUNITY): Payer: Self-pay | Admitting: General Surgery

## 2016-04-26 DIAGNOSIS — K9423 Gastrostomy malfunction: Secondary | ICD-10-CM
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
## 2016-04-26 NOTE — Progress Notes (Deleted)
error 

## 2016-04-26 NOTE — Progress Notes (Signed)
error 

## 2016-04-27 ENCOUNTER — Inpatient Hospital Stay (HOSPITAL_COMMUNITY): Admission: RE | Admit: 2016-04-27 | Payer: PRIVATE HEALTH INSURANCE | Source: Ambulatory Visit

## 2017-03-14 DEATH — deceased
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
